# Patient Record
Sex: Female | Born: 2000 | Race: Black or African American | Hispanic: No | Marital: Single | Smoking: Never smoker
Health system: Southern US, Community
[De-identification: ages and names within clinical notes are randomized; demographics above are authoritative.]

## PROBLEM LIST (undated history)

## (undated) DIAGNOSIS — E669 Obesity, unspecified: Secondary | ICD-10-CM

## (undated) DIAGNOSIS — J329 Chronic sinusitis, unspecified: Secondary | ICD-10-CM

## (undated) DIAGNOSIS — T7840XA Allergy, unspecified, initial encounter: Secondary | ICD-10-CM

## (undated) HISTORY — DX: Allergy, unspecified, initial encounter: T78.40XA

## (undated) HISTORY — DX: Chronic sinusitis, unspecified: J32.9

## (undated) HISTORY — PX: NO PAST SURGERIES: SHX2092

## (undated) HISTORY — DX: Obesity, unspecified: E66.9

---
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
## 2022-05-29 ENCOUNTER — Encounter: Payer: Self-pay | Admitting: Student

## 2022-07-08 DIAGNOSIS — G44209 Tension-type headache, unspecified, not intractable: Secondary | ICD-10-CM | POA: Diagnosis not present

## 2023-02-20 NOTE — L&D Delivery Note (Signed)
 [facility] Delivery Note  Robin Shepherd is a 23 y.o. G1P0 s/p SVD at [redacted]w[redacted]d. She was admitted for pre-term labor.   ROM: 6h 28m with clear fluid GBS Status: negative Maximum Maternal Temperature: 99.34F  Labor Progress: Initial SVE 5/80/-1, progressed with AROM, and pitocin for contractions, delivered soon after  Delivery Date/Time: 01/18/2024 1948 Delivery: Called to room and patient was complete and pushing. Head delivered ROA. No nuchal cord present. Shoulder and body delivered in usual fashion. Infant with spontaneous cry, placed on mother's abdomen, dried and stimulated. Cord clamped x 2 after cord stopped pulsing, and cut by father of baby. Cord blood drawn. Placenta delivered spontaneously, intact, with 3-vessel cord. Fundus firm with massage and Pitocin. Labia, perineum, vagina, and cervix inspected, 2nd degree perineal laceration found and repaired with 3-0 Vicryl.   Placenta: Intact, delivered spontaneously Complications: None Lacerations: 2nd degree perineal, repaired EBL: pending Analgesia: Epidural   Infant: Viable female  APGARs 9, 9  Weight pending  Charlie DELENA Courts, MD 01/18/2024, 8:36 PM

## 2023-07-27 ENCOUNTER — Telehealth: Admitting: Physician Assistant

## 2023-07-27 DIAGNOSIS — G43009 Migraine without aura, not intractable, without status migrainosus: Secondary | ICD-10-CM | POA: Diagnosis not present

## 2023-07-27 MED ORDER — SUMATRIPTAN SUCCINATE 25 MG PO TABS: 25.0000 mg | ORAL_TABLET | Freq: Once | ORAL | 0 refills | Status: DC

## 2023-07-27 NOTE — Patient Instructions (Signed)
  Cabrini G Okey, thank you for joining Hyla Maillard, PA-C for today's virtual visit.  While this provider is not your primary care provider (PCP), if your PCP is located in our provider database this encounter information will be shared with them immediately following your visit.   A [facility] MyChart account gives you access to today's visit and all your visits, tests, and labs performed at [facility] " click here if you don't have a [facility] MyChart account or go to [email or web address]  Consent: (Patient) Cassaundra Cleverly provided verbal consent for this virtual visit at the beginning of the encounter.  Current Medications:  Current Outpatient Medications:    aspirin-acetaminophen-caffeine (EXCEDRIN MIGRAINE) 250-250-65 MG tablet, Take by mouth every 6 (six) hours as needed for headache., Disp: , Rfl:    SUMAtriptan (IMITREX) 25 MG tablet, Take 1 tablet (25 mg total) by mouth once for 1 dose. May repeat in 2 hours if headache persists or recurs., Disp: 10 tablet, Rfl: 0   Medications ordered in this encounter:  Meds ordered this encounter  Medications   SUMAtriptan (IMITREX) 25 MG tablet    Sig: Take 1 tablet (25 mg total) by mouth once for 1 dose. May repeat in 2 hours if headache persists or recurs.    Dispense:  10 tablet    Refill:  0    Supervising Provider:   Corine Dice [[id number]]     *If you need refills on other medications prior to your next appointment, please contact your pharmacy*  Follow-Up: Call back or seek an in-person evaluation if the symptoms worsen or if the condition fails to improve as anticipated.  [facility] [phone]  Other Instructions Please take the Sumatriptan as directed for acute migraine.  Increase fluids.  Please contact OBGYN first thing this coming week for follow-up and ongoing management during pregnancy.  ER for any acutely worsening symptoms despite treatment.   If you have been  instructed to have an in-person evaluation today at a local Urgent Care facility, please use the link below. It will take you to a list of all of our available [facility], including address, phone number and hours of operation. Please do not delay care.  [facility]  If you or a family member do not have a primary care provider, use the link below to schedule a visit and establish care. When you choose a [facility] primary care physician or advanced practice provider, you gain a long-term partner in health. Find a Primary Care Provider  Learn more about [facility] in-office and virtual care options:  - Get Care Now

## 2023-07-27 NOTE — Progress Notes (Signed)
 Virtual Visit Consent   Robin Shepherd, you are scheduled for a virtual visit with a  provider today. Just as with appointments in the office, your consent must be obtained to participate. Your consent will be active for this visit and any virtual visit you may have with one of our providers in the next 365 days. If you have a MyChart account, a copy of this consent can be sent to you electronically.  As this is a virtual visit, video technology does not allow for your provider to perform a traditional examination. This may limit your provider's ability to fully assess your condition. If your provider identifies any concerns that need to be evaluated in person or the need to arrange testing (such as labs, EKG, etc.), we will make arrangements to do so. Although advances in technology are sophisticated, we cannot ensure that it will always work on either your end or our end. If the connection with a video visit is poor, the visit may have to be switched to a telephone visit. With either a video or telephone visit, we are not always able to ensure that we have a secure connection.  By engaging in this virtual visit, you consent to the provision of healthcare and authorize for your insurance to be billed (if applicable) for the services provided during this visit. Depending on your insurance coverage, you may receive a charge related to this service.  I need to obtain your verbal consent now. Are you willing to proceed with your visit today? Robin Shepherd has provided verbal consent on 07/27/2023 for a virtual visit (video or telephone). Hyla Maillard, [place]  Date: 07/27/2023 6:56 PM   Virtual Visit via Video Note   I, Hyla Maillard, connected with  Robin Shepherd  ([id number], June 07, 2000) on 07/27/23 at  7:00 PM EDT by a video-enabled telemedicine application and verified that I am speaking with the correct person using two identifiers.  Location: Patient: Virtual Visit  Location Patient: Home Provider: Virtual Visit Location Provider: Home Office   I discussed the limitations of evaluation and management by telemedicine and the availability of in person appointments. The patient expressed understanding and agreed to proceed.    History of Present Illness: Robin Shepherd is a 23 y.o. who identifies as a female who was assigned female at birth, and is being seen today for periodic migraine headache, more consistent over the past few weeks. Notes headache is frontal and sometimes unilateral, or other times bilateral. Is associated with nausea. Denies aura. Notes that symptoms seem worse when its hotter outside. Notes she is staying well-hydrated. Notes well-balanced diet. Is currently pregnant, followed by OBGYN, having initial appt already. Noted normal BP at that time. Has been taking OTC tylenol and Tylenol-Aspirine-Caffeine in combination without relief of headaches. Notes pain is 6/10. Notes getting a migraine about once every few weeks.   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 May repeat in 2 hours if headache persists or recurs., Disp: 10 tablet, Rfl: 0  Observations/Objective: Patient is well-developed, well-nourished in no acute distress.  Resting comfortably at home.  Head is normocephalic, atraumatic.  No labored breathing. Speech is clear and coherent with logical content.  Patient is alert and oriented at baseline.   Assessment and Plan: 1. Migraine without aura and without status  migrainosus, not intractable (Primary) - SUMAtriptan (IMITREX) 25 MG tablet; Take 1 tablet (25 mg total) by mouth once for 1 dose. May repeat in 2 hours if headache persists or recurs.  Dispense: 10 tablet; Refill: 0  No alarm signs or symptoms. Patient is > [redacted] weeks pregnant without history of cardiovascular disease. Poor response to first line treatment (Acetaminophen) and second line treatments (Acetaminophen-ASA-caffeine). As such, will treat with low dose Sumatriptan for abortive therapy per UTD guidelines. Increase fluids. Keep symptoms journal. She is to contact OBGYN first thing this coming week for follow-up and ongoing management during pregnancy. ER precautions reviewed.  Follow Up Instructions: I discussed the assessment and treatment plan with the patient. The patient was provided an opportunity to ask questions and all were answered. The patient agreed with the plan and demonstrated an understanding of the instructions.  A copy of instructions were sent to the patient via MyChart unless otherwise noted below.   The patient was advised to call back or seek an in-person evaluation if the symptoms worsen or if the condition fails to improve as anticipated.    Hyla Maillard, PA-C

## 2023-07-30 ENCOUNTER — Encounter: Payer: Self-pay | Admitting: *Deleted

## 2023-12-12 ENCOUNTER — Other Ambulatory Visit: Payer: Self-pay | Admitting: Nurse Practitioner

## 2023-12-12 DIAGNOSIS — Z36 Encounter for antenatal screening for chromosomal anomalies: Secondary | ICD-10-CM

## 2023-12-12 DIAGNOSIS — Z113 Encounter for screening for infections with a predominantly sexual mode of transmission: Secondary | ICD-10-CM | POA: Diagnosis not present

## 2023-12-12 DIAGNOSIS — Z3182 Encounter for Rh incompatibility status: Secondary | ICD-10-CM | POA: Diagnosis not present

## 2023-12-12 DIAGNOSIS — Z31430 Encounter of female for testing for genetic disease carrier status for procreative management: Secondary | ICD-10-CM | POA: Diagnosis not present

## 2023-12-12 DIAGNOSIS — Z3401 Encounter for supervision of normal first pregnancy, first trimester: Secondary | ICD-10-CM | POA: Diagnosis not present

## 2023-12-12 DIAGNOSIS — Z23 Encounter for immunization: Secondary | ICD-10-CM | POA: Diagnosis not present

## 2023-12-12 DIAGNOSIS — Z3403 Encounter for supervision of normal first pregnancy, third trimester: Secondary | ICD-10-CM | POA: Diagnosis not present

## 2023-12-12 LAB — OB RESULTS CONSOLE HEPATITIS B SURFACE ANTIGEN: Hepatitis B Surface Ag: NEGATIVE

## 2023-12-12 LAB — OB RESULTS CONSOLE GC/CHLAMYDIA
Chlamydia: NEGATIVE
Neisseria Gonorrhea: NEGATIVE

## 2023-12-12 LAB — HEPATITIS C ANTIBODY: HCV Ab: NEGATIVE

## 2023-12-12 LAB — OB RESULTS CONSOLE VARICELLA ZOSTER ANTIBODY, IGG: Varicella: IMMUNE

## 2023-12-12 LAB — OB RESULTS CONSOLE RUBELLA ANTIBODY, IGM: Rubella: IMMUNE

## 2023-12-12 LAB — OB RESULTS CONSOLE HIV ANTIBODY (ROUTINE TESTING): HIV: NONREACTIVE

## 2024-01-02 DIAGNOSIS — O0930 Supervision of pregnancy with insufficient antenatal care, unspecified trimester: Secondary | ICD-10-CM | POA: Diagnosis not present

## 2024-01-02 DIAGNOSIS — O2603 Excessive weight gain in pregnancy, third trimester: Secondary | ICD-10-CM | POA: Diagnosis not present

## 2024-01-02 DIAGNOSIS — Z3403 Encounter for supervision of normal first pregnancy, third trimester: Secondary | ICD-10-CM | POA: Diagnosis not present

## 2024-01-02 DIAGNOSIS — O99013 Anemia complicating pregnancy, third trimester: Secondary | ICD-10-CM | POA: Diagnosis not present

## 2024-01-15 DIAGNOSIS — O99013 Anemia complicating pregnancy, third trimester: Secondary | ICD-10-CM | POA: Diagnosis not present

## 2024-01-15 DIAGNOSIS — O2603 Excessive weight gain in pregnancy, third trimester: Secondary | ICD-10-CM | POA: Diagnosis not present

## 2024-01-15 DIAGNOSIS — Z3403 Encounter for supervision of normal first pregnancy, third trimester: Secondary | ICD-10-CM | POA: Diagnosis not present

## 2024-01-16 ENCOUNTER — Encounter

## 2024-01-17 ENCOUNTER — Inpatient Hospital Stay (HOSPITAL_COMMUNITY)
Admission: AD | Admit: 2024-01-17 | Discharge: 2024-01-17 | Disposition: A | Discharge status: Home / Self Care | Attending: Emergency Medicine | Admitting: Emergency Medicine

## 2024-01-17 ENCOUNTER — Encounter (HOSPITAL_BASED_OUTPATIENT_CLINIC_OR_DEPARTMENT_OTHER): Payer: Self-pay | Admitting: Emergency Medicine

## 2024-01-17 ENCOUNTER — Other Ambulatory Visit: Payer: Self-pay

## 2024-01-17 DIAGNOSIS — Z3A36 36 weeks gestation of pregnancy: Secondary | ICD-10-CM | POA: Insufficient documentation

## 2024-01-17 DIAGNOSIS — I959 Hypotension, unspecified: Secondary | ICD-10-CM | POA: Diagnosis not present

## 2024-01-17 DIAGNOSIS — O209 Hemorrhage in early pregnancy, unspecified: Secondary | ICD-10-CM | POA: Insufficient documentation

## 2024-01-17 DIAGNOSIS — O200 Threatened abortion: Secondary | ICD-10-CM | POA: Diagnosis not present

## 2024-01-17 DIAGNOSIS — Z0371 Encounter for suspected problem with amniotic cavity and membrane ruled out: Secondary | ICD-10-CM | POA: Diagnosis not present

## 2024-01-17 DIAGNOSIS — R58 Hemorrhage, not elsewhere classified: Secondary | ICD-10-CM | POA: Diagnosis not present

## 2024-01-17 DIAGNOSIS — Z3689 Encounter for other specified antenatal screening: Secondary | ICD-10-CM

## 2024-01-17 LAB — URINALYSIS, ROUTINE W REFLEX MICROSCOPIC
Bilirubin Urine: NEGATIVE
Glucose, UA: NEGATIVE mg/dL
Ketones, ur: NEGATIVE mg/dL
Nitrite: NEGATIVE
Protein, ur: NEGATIVE mg/dL
Specific Gravity, Urine: <=1.005 (ref 1.005–1.030)
pH: 6 (ref 5.0–8.0)

## 2024-01-17 LAB — BASIC METABOLIC PANEL WITH GFR
Anion gap: 11 (ref 5–15)
BUN: 8 mg/dL (ref 6–20)
CO2: 21 mmol/L — ABNORMAL LOW (ref 22–32)
Calcium: 9.3 mg/dL (ref 8.9–10.3)
Chloride: 104 mmol/L (ref 98–111)
Creatinine, Ser: 0.69 mg/dL (ref 0.44–1.00)
GFR, Estimated: >60 mL/min (ref >60)
Glucose, Bld: 96 mg/dL (ref 70–99)
Potassium: 4.2 mmol/L (ref 3.5–5.1)
Sodium: 136 mmol/L (ref 135–145)

## 2024-01-17 LAB — CBC WITH DIFFERENTIAL/PLATELET
Abs Immature Granulocytes: 0.04 K/uL (ref 0.00–0.07)
Basophils Absolute: 0 K/uL (ref 0.0–0.1)
Basophils Relative: 0 %
Eosinophils Absolute: 0.1 K/uL (ref 0.0–0.5)
Eosinophils Relative: 1 %
HCT: 31.9 % — ABNORMAL LOW (ref 36.0–46.0)
Hemoglobin: 10.8 g/dL — ABNORMAL LOW (ref 12.0–15.0)
Immature Granulocytes: 0 %
Lymphocytes Relative: 18 %
Lymphs Abs: 1.8 K/uL (ref 0.7–4.0)
MCH: 25.3 pg — ABNORMAL LOW (ref 26.0–34.0)
MCHC: 33.9 g/dL (ref 30.0–36.0)
MCV: 74.7 fL — ABNORMAL LOW (ref 80.0–100.0)
Monocytes Absolute: 1.1 K/uL — ABNORMAL HIGH (ref 0.1–1.0)
Monocytes Relative: 11 %
Neutro Abs: 6.9 K/uL (ref 1.7–7.7)
Neutrophils Relative %: 70 %
Platelets: 235 K/uL (ref 150–400)
RBC: 4.27 MIL/uL (ref 3.87–5.11)
RDW: 14.5 % (ref 11.5–15.5)
WBC: 9.8 K/uL (ref 4.0–10.5)
nRBC: 0 % (ref 0.0–0.2)

## 2024-01-17 LAB — URINALYSIS, MICROSCOPIC (REFLEX)

## 2024-01-17 LAB — POCT FERN TEST: POCT Fern Test: NEGATIVE

## 2024-01-17 MED ORDER — SODIUM CHLORIDE 0.9 % IV BOLUS
500.0000 mL | Freq: Once | INTRAVENOUS | Status: AC
  Administered 2024-01-17: 500 mL via INTRAVENOUS

## 2024-01-17 NOTE — ED Triage Notes (Signed)
 Pt 8 almost 9 mos pregnant. Started having some blood when wiping.

## 2024-01-17 NOTE — ED Notes (Signed)
 During this pts assessment, it was determined by the edp that her transfer to a higher level of care was urgent due to her OB status, a current [facility] unit on scene noted that the other unit was located in [place] and they are only isolett capable.  [facility] was called to expedite this pts care

## 2024-01-17 NOTE — ED Provider Notes (Signed)
 [facility] is out of network at [facility] so EMS had to be contacted due to times.     Myia Bergh, MD 01/17/24 0430

## 2024-01-17 NOTE — Progress Notes (Signed)
 9582BETHA BOYDEN called for pt at [facility] ED. Came in with c/o vaginal bleeding when wiping and abdominal pain rated 4/10. PNC at [facility] [place] per pt. Endorses fetal movement. Unsure of membrane status.  9580: Pt placed on monitor.  0430BETHA Florence Sayre, [facility] ED RN about remote monitoring going down. Will troubleshoot.   9566: Dr. Abigail called about pt, ED MD already called, will transfer to MAU.  0435: Lum, MAU RN called for report.  0435BETHA Sayre, [facility] ED RN called, unable to re-establish remote monitoring. Transport at bedside for transfer to MAU.

## 2024-01-17 NOTE — MAU Note (Signed)
 Robin Shepherd is a 23 y.o. at [redacted]w[redacted]d here in MAU reporting: Gush of fluid around 0300, not feeling any ctx or pain. Some bloody show, reports +FM  LMP:  Onset of complaint: 0300 Pain score: 0/10 Vitals:   01/17/24 0417 01/17/24 0527  BP: (!) 98/54 (!) 133/96  Pulse: 91 94  Resp: 20 18  Temp: 98.4 F (36.9 C) 98.5 F (36.9 C)  SpO2: 100% 100%     FHT: pt placed on EFM  Lab orders placed from triage: [place]

## 2024-01-17 NOTE — MAU Provider Note (Signed)
 Event Date/Time   First Provider Initiated Contact with Patient 01/17/24 0540      S: [name] is a 23 y.o. G1P0 at [redacted]w[redacted]d  who presents to MAU today complaining of gush of fluid at 0300. She endorses vaginal bleeding that she describes as mucous consistency.  She denies contractions. She reports normal fetal movement.    O: BP (!) 133/96 (BP Location: Left Arm) Comment: pt moving  Pulse 94   Temp 98.5 F (36.9 C) (Oral)   Resp 18   Ht 5' 5 (1.651 m)   Wt 84.4 kg   SpO2 100%   BMI 30.95 kg/m  GENERAL: Well-developed, well-nourished female in no acute distress.  HEAD: Normocephalic, atraumatic.  CHEST: Normal effort of breathing, regular heart rate ABDOMEN: Soft, nontender, gravid PELVIC: Normal external female genitalia. Vagina is pink and rugated. Cervix with normal contour, no lesions. Normal discharge.  Negative pooling. No leaking noted with valsalva maneuver. Fern Collected  Cervical exam:  Dilation: 3 Effacement (%): 40 Station: -2 Presentation: Vertex Exam by:: Amy S., RN   Fetal Monitoring: FHT: 130 bpm, Mod Var, -Decels, +Accels Toco: Irregular  Results for orders placed or performed during the hospital encounter of 01/17/24 (from the past 24 hours)  CBC with Differential     Status: Abnormal   Collection Time: 01/17/24  4:33 AM  Result Value Ref Range   WBC 9.8 4.0 - 10.5 K/uL   RBC 4.27 3.87 - 5.11 MIL/uL   Hemoglobin 10.8 (L) 12.0 - 15.0 g/dL   HCT 68.0 (L) 63.9 - 53.9 %   MCV 74.7 (L) 80.0 - 100.0 fL   MCH 25.3 (L) 26.0 - 34.0 pg   MCHC 33.9 30.0 - 36.0 g/dL   RDW 85.4 88.4 - 84.4 %   Platelets 235 150 - 400 K/uL   nRBC 0.0 0.0 - 0.2 %   Neutrophils Relative % 70 %   Neutro Abs 6.9 1.7 - 7.7 K/uL   Lymphocytes Relative 18 %   Lymphs Abs 1.8 0.7 - 4.0 K/uL   Monocytes Relative 11 %   Monocytes Absolute 1.1 (H) 0.1 - 1.0 K/uL   Eosinophils Relative 1 %   Eosinophils Absolute 0.1 0.0 - 0.5 K/uL   Basophils Relative 0 %   Basophils  Absolute 0.0 0.0 - 0.1 K/uL   Immature Granulocytes 0 %   Abs Immature Granulocytes 0.04 0.00 - 0.07 K/uL  Basic metabolic panel     Status: Abnormal   Collection Time: 01/17/24  4:33 AM  Result Value Ref Range   Sodium 136 135 - 145 mmol/L   Potassium 4.2 3.5 - 5.1 mmol/L   Chloride 104 98 - 111 mmol/L   CO2 21 (L) 22 - 32 mmol/L   Glucose, Bld 96 70 - 99 mg/dL   BUN 8 6 - 20 mg/dL   Creatinine, Ser 9.30 0.44 - 1.00 mg/dL   Calcium 9.3 8.9 - 89.6 mg/dL   GFR, Estimated >39 >39 mL/min   Anion gap 11 5 - 15  Urinalysis, Routine w reflex microscopic -Urine, Clean Catch     Status: Abnormal   Collection Time: 01/17/24  4:45 AM  Result Value Ref Range   Color, Urine STRAW (A) YELLOW   APPearance HAZY (A) CLEAR   Specific Gravity, Urine <=1.005 1.005 - 1.030   pH 6.0 5.0 - 8.0   Glucose, UA NEGATIVE NEGATIVE mg/dL   Hgb urine dipstick MODERATE (A) NEGATIVE   Bilirubin Urine NEGATIVE NEGATIVE  Ketones, ur NEGATIVE NEGATIVE mg/dL   Protein, ur NEGATIVE NEGATIVE mg/dL   Nitrite NEGATIVE NEGATIVE   Leukocytes,Ua TRACE (A) NEGATIVE  Urinalysis, Microscopic (reflex)     Status: Abnormal   Collection Time: 01/17/24  4:45 AM  Result Value Ref Range   RBC / HPF 0-5 0 - 5 RBC/hpf   WBC, UA 11-20 0 - 5 WBC/hpf   Bacteria, UA FEW (A) NONE SEEN   Squamous Epithelial / HPF 0-5 0 - 5 /HPF   Sperm, UA PRESENT   POCT fern test     Status: None   Collection Time: 01/17/24  5:33 AM  Result Value Ref Range   POCT Fern Test Negative = intact amniotic membranes     A: SIUP at [redacted]w[redacted]d  Cat I FT Membranes intact  P: Fern negative x 2. NST reactive Will monitor and have nurse recheck for cervical change.   Synthia Raisin, CNM 01/17/2024 5:40 AM   Reassessment (6:36 AM) -Nurse reports no cervical change. -Precautions provided in AVS.  -Discharged to home in stable condition.  Raisin LITTIE Synthia MSN, CNM Advanced Practice Provider, [facility] for [employer]

## 2024-01-17 NOTE — ED Provider Notes (Addendum)
 [facility] Provider Note   CSN: [id number] Arrival date & time: 01/17/24  0400     Patient presents with: Vaginal Bleeding   Robin Shepherd is a 23 y.o. female.   The history is provided by the patient.  Vaginal Bleeding Quality:  Dark red Severity:  Mild Onset quality:  Sudden Timing:  Constant Progression:  Unchanged Chronicity:  New Possible pregnancy: yes   Context: spontaneously   Relieved by:  Nothing Worsened by:  Nothing Ineffective treatments:  None tried Risk factors: no bleeding disorder   Patient is G2P0, at 80 and 1 presents with leakage of blood and fluid per vagina.       Prior to Admission medications   Medication Sig Start Date End Date Taking? Authorizing Provider  ferrous sulfate 324 MG TBEC Take 324 mg by mouth.   Yes [provider]  folic acid (FOLVITE) 400 MCG tablet Take 400 mcg by mouth daily.   Yes [provider]  Prenatal Vit-Fe Fumarate-FA (PRENATAL MULTIVITAMIN) TABS tablet Take 1 tablet by mouth daily at 12 noon.   Yes [provider]  aspirin-acetaminophen-caffeine (EXCEDRIN MIGRAINE) 250-250-65 MG tablet Take by mouth every 6 (six) hours as needed for headache.    [provider]  SUMAtriptan  (IMITREX ) 25 MG tablet Take 1 tablet (25 mg total) by mouth once for 1 dose. May repeat in 2 hours if headache persists or recurs. 07/27/23 07/27/23  Gladis Elsie BROCKS, PA-C    Allergies: Patient has no known allergies.    Review of Systems  Genitourinary:  Positive for vaginal bleeding.  All other systems reviewed and are negative.   Updated Vital Signs BP (!) 98/54 (BP Location: Right Arm)   Pulse 91   Temp 98.4 F (36.9 C) (Oral)   Resp 20   Ht 5' 5 (1.651 m)   Wt 84.4 kg   SpO2 100%   BMI 30.95 kg/m   Physical Exam Vitals and nursing note reviewed.  Constitutional:      General: She is not in acute distress.    Appearance: She is well-developed.   HENT:     Head: Normocephalic and atraumatic.     Nose: Nose normal.  Eyes:     Pupils: Pupils are equal, round, and reactive to light.  Cardiovascular:     Rate and Rhythm: Normal rate and regular rhythm.     Pulses: Normal pulses.     Heart sounds: Normal heart sounds.  Pulmonary:     Effort: Pulmonary effort is normal. No respiratory distress.     Breath sounds: Normal breath sounds.  Abdominal:     General: Bowel sounds are normal.     Palpations: Abdomen is soft.     Tenderness: There is no abdominal tenderness. There is no guarding or rebound.     Comments: Gravid, fetal movement palpable   Genitourinary:    Comments: Sterile glove exam amniotic fluid per vagina.  No fetal parts  Musculoskeletal:        General: Normal range of motion.     Cervical back: Neck supple.  Skin:    General: Skin is warm and dry.     Capillary Refill: Capillary refill takes less than 2 seconds.     Findings: No erythema or rash.  Neurological:     General: No focal deficit present.     Deep Tendon Reflexes: Reflexes normal.  Psychiatric:        Mood and Affect:  Mood normal.     (all labs ordered are listed, but only abnormal results are displayed) Labs Reviewed  WET PREP, GENITAL  CBC WITH DIFFERENTIAL/PLATELET  BASIC METABOLIC PANEL WITH GFR  GC/CHLAMYDIA PROBE AMP ([place]) NOT AT [facility]    EKG: None  Radiology: No results found.   Procedures   Medications Ordered in the ED  sodium chloride 0.9 % bolus 500 mL (has no administration in time range)                                    Medical Decision Making Patient with bleeding and is 36/1   Amount and/or Complexity of Data Reviewed External Data Reviewed: notes.    Details: Previous notes reviewed  Labs: ordered. Discussion of management or test interpretation with external provider(s): 4:15 AM case d/w Dr. Abigail of OB who accepts to the MAU  Risk Risk Details: Patient placed on toco and transmitted to [facility]  hospital immediately.  Sterile glove exam performed with chaperones present.  No signs of imminent delivery, no presenting parts.  Discussed with Dr. Abigail who accepts to the MAU. Labs sent. Good variability on monitor.  Will need to be seen by OB, will transfer to MAU for definitive care by OB.       Final diagnoses:  Vaginal bleeding in pregnancy   The patient appears reasonably stabilized for admission considering the current resources, flow, and capabilities available in the ED at this time, and I doubt any other [facility] requiring further screening and/or treatment in the ED prior to admission.     Elvie Palomo, MD 01/17/24 9551

## 2024-01-18 ENCOUNTER — Inpatient Hospital Stay (HOSPITAL_COMMUNITY): Admitting: Anesthesiology

## 2024-01-18 ENCOUNTER — Encounter (HOSPITAL_COMMUNITY): Payer: Self-pay | Admitting: Obstetrics and Gynecology

## 2024-01-18 ENCOUNTER — Inpatient Hospital Stay (HOSPITAL_COMMUNITY)
Admission: AD | Admit: 2024-01-18 | Discharge: 2024-01-20 | DRG: 807 | Disposition: A | Discharge status: Home / Self Care | Payer: Self-pay | Attending: Obstetrics and Gynecology | Admitting: Obstetrics and Gynecology

## 2024-01-18 DIAGNOSIS — Z3A36 36 weeks gestation of pregnancy: Secondary | ICD-10-CM | POA: Diagnosis not present

## 2024-01-18 DIAGNOSIS — O99214 Obesity complicating childbirth: Secondary | ICD-10-CM | POA: Diagnosis not present

## 2024-01-18 DIAGNOSIS — O209 Hemorrhage in early pregnancy, unspecified: Secondary | ICD-10-CM | POA: Diagnosis not present

## 2024-01-18 DIAGNOSIS — O9902 Anemia complicating childbirth: Secondary | ICD-10-CM | POA: Diagnosis present

## 2024-01-18 DIAGNOSIS — O0933 Supervision of pregnancy with insufficient antenatal care, third trimester: Secondary | ICD-10-CM | POA: Diagnosis not present

## 2024-01-18 DIAGNOSIS — Z0371 Encounter for suspected problem with amniotic cavity and membrane ruled out: Secondary | ICD-10-CM | POA: Diagnosis not present

## 2024-01-18 LAB — COMPREHENSIVE METABOLIC PANEL WITH GFR
ALT: 10 U/L (ref 0–44)
AST: 41 U/L (ref 15–41)
Albumin: 3.3 g/dL — ABNORMAL LOW (ref 3.5–5.0)
Alkaline Phosphatase: 351 U/L — ABNORMAL HIGH (ref 38–126)
Anion gap: 15 (ref 5–15)
BUN: 5 mg/dL — ABNORMAL LOW (ref 6–20)
CO2: 17 mmol/L — ABNORMAL LOW (ref 22–32)
Calcium: 9.1 mg/dL (ref 8.9–10.3)
Chloride: 103 mmol/L (ref 98–111)
Creatinine, Ser: 0.92 mg/dL (ref 0.44–1.00)
GFR, Estimated: >60 mL/min (ref >60)
Glucose, Bld: 95 mg/dL (ref 70–99)
Potassium: 3.5 mmol/L (ref 3.5–5.1)
Sodium: 135 mmol/L (ref 135–145)
Total Bilirubin: 0.9 mg/dL (ref 0.0–1.2)
Total Protein: 7.1 g/dL (ref 6.5–8.1)

## 2024-01-18 LAB — CBC
HCT: 33.5 % — ABNORMAL LOW (ref 36.0–46.0)
Hemoglobin: 11.3 g/dL — ABNORMAL LOW (ref 12.0–15.0)
MCH: 25.2 pg — ABNORMAL LOW (ref 26.0–34.0)
MCHC: 33.7 g/dL (ref 30.0–36.0)
MCV: 74.8 fL — ABNORMAL LOW (ref 80.0–100.0)
Platelets: 264 K/uL (ref 150–400)
RBC: 4.48 MIL/uL (ref 3.87–5.11)
RDW: 14.6 % (ref 11.5–15.5)
WBC: 17.7 K/uL — ABNORMAL HIGH (ref 4.0–10.5)
nRBC: 0 % (ref 0.0–0.2)

## 2024-01-18 LAB — TYPE AND SCREEN
ABO/RH(D): O POS
Antibody Screen: NEGATIVE

## 2024-01-18 LAB — GROUP B STREP BY PCR: Group B strep by PCR: NEGATIVE

## 2024-01-18 LAB — SYPHILIS: RPR W/REFLEX TO RPR TITER AND TREPONEMAL ANTIBODIES, TRADITIONAL SCREENING AND DIAGNOSIS ALGORITHM: RPR Ser Ql: NONREACTIVE

## 2024-01-18 MED ORDER — LIDOCAINE-EPINEPHRINE (PF) 2 %-1:200000 IJ SOLN
INTRAMUSCULAR | Status: DC | PRN
  Administered 2024-01-18: 3 mL via EPIDURAL

## 2024-01-18 MED ORDER — PRENATAL MULTIVITAMIN CH
1.0000 | ORAL_TABLET | Freq: Every day | ORAL | Status: DC
  Administered 2024-01-19 – 2024-01-20 (×2): 1 via ORAL
  Filled 2024-01-18 (×2): qty 1

## 2024-01-18 MED ORDER — OXYTOCIN BOLUS FROM INFUSION
333.0000 mL | Freq: Once | INTRAVENOUS | Status: AC
  Administered 2024-01-18: 333 mL via INTRAVENOUS

## 2024-01-18 MED ORDER — OXYCODONE-ACETAMINOPHEN 5-325 MG PO TABS: 1.0000 | ORAL_TABLET | ORAL | Status: DC | PRN

## 2024-01-18 MED ORDER — DIPHENHYDRAMINE HCL 25 MG PO CAPS: 25.0000 mg | ORAL_CAPSULE | Freq: Four times a day (QID) | ORAL | Status: DC | PRN

## 2024-01-18 MED ORDER — SENNOSIDES-DOCUSATE SODIUM 8.6-50 MG PO TABS
2.0000 | ORAL_TABLET | ORAL | Status: DC
  Administered 2024-01-18 – 2024-01-20 (×2): 2 via ORAL
  Filled 2024-01-18 (×2): qty 2

## 2024-01-18 MED ORDER — LACTATED RINGERS IV SOLN: 500.0000 mL | INTRAVENOUS | Status: DC | PRN

## 2024-01-18 MED ORDER — PHENYLEPHRINE 80 MCG/ML (10ML) SYRINGE FOR IV PUSH (FOR BLOOD PRESSURE SUPPORT)
80.0000 ug | PREFILLED_SYRINGE | INTRAVENOUS | Status: DC | PRN
Start: 2024-01-18 — End: 2024-01-18

## 2024-01-18 MED ORDER — SODIUM CHLORIDE 0.9% FLUSH: 3.0000 mL | INTRAVENOUS | Status: DC | PRN

## 2024-01-18 MED ORDER — EPHEDRINE 5 MG/ML INJ: 10.0000 mg | INTRAVENOUS | Status: DC | PRN

## 2024-01-18 MED ORDER — OXYTOCIN-SODIUM CHLORIDE 30-0.9 UT/500ML-% IV SOLN
2.5000 [IU]/h | INTRAVENOUS | Status: DC
  Filled 2024-01-18: qty 500

## 2024-01-18 MED ORDER — FENTANYL-BUPIVACAINE-NACL 0.5-0.125-0.9 MG/250ML-% EP SOLN
12.0000 mL/h | EPIDURAL | Status: DC | PRN
  Administered 2024-01-18: 12 mL/h via EPIDURAL
  Filled 2024-01-18: qty 250

## 2024-01-18 MED ORDER — PENICILLIN G POT IN DEXTROSE 60000 UNIT/ML IV SOLN
3.0000 10*6.[IU] | INTRAVENOUS | Status: DC
  Administered 2024-01-18 (×2): 3 10*6.[IU] via INTRAVENOUS
  Filled 2024-01-18 (×3): qty 50

## 2024-01-18 MED ORDER — SOD CITRATE-CITRIC ACID 500-334 MG/5ML PO SOLN: 30.0000 mL | ORAL | Status: DC | PRN

## 2024-01-18 MED ORDER — LACTATED RINGERS IV SOLN
500.0000 mL | Freq: Once | INTRAVENOUS | Status: AC
  Administered 2024-01-18: 500 mL via INTRAVENOUS

## 2024-01-18 MED ORDER — MEASLES, MUMPS & RUBELLA VAC ~~LOC~~ SUSR: 0.5000 mL | Freq: Once | SUBCUTANEOUS | Status: DC

## 2024-01-18 MED ORDER — FENTANYL CITRATE (PF) 100 MCG/2ML IJ SOLN: 50.0000 ug | INTRAMUSCULAR | Status: DC | PRN

## 2024-01-18 MED ORDER — PHENYLEPHRINE 80 MCG/ML (10ML) SYRINGE FOR IV PUSH (FOR BLOOD PRESSURE SUPPORT): 80.0000 ug | PREFILLED_SYRINGE | INTRAVENOUS | Status: DC | PRN

## 2024-01-18 MED ORDER — BENZOCAINE-MENTHOL 20-0.5 % EX AERO
1.0000 | INHALATION_SPRAY | CUTANEOUS | Status: DC | PRN
  Administered 2024-01-19: 1 via TOPICAL
  Filled 2024-01-18: qty 56

## 2024-01-18 MED ORDER — OXYTOCIN-SODIUM CHLORIDE 30-0.9 UT/500ML-% IV SOLN
1.0000 m[IU]/min | INTRAVENOUS | Status: DC
  Administered 2024-01-18: 2 m[IU]/min via INTRAVENOUS

## 2024-01-18 MED ORDER — DIPHENHYDRAMINE HCL 50 MG/ML IJ SOLN: 12.5000 mg | INTRAMUSCULAR | Status: DC | PRN

## 2024-01-18 MED ORDER — LACTATED RINGERS IV SOLN: 500.0000 mL | Freq: Once | INTRAVENOUS | Status: DC

## 2024-01-18 MED ORDER — ONDANSETRON HCL 4 MG/2ML IJ SOLN: 4.0000 mg | INTRAMUSCULAR | Status: DC | PRN

## 2024-01-18 MED ORDER — LACTATED RINGERS IV SOLN: INTRAVENOUS | Status: DC

## 2024-01-18 MED ORDER — ONDANSETRON HCL 4 MG/2ML IJ SOLN
INTRAMUSCULAR | Status: AC
  Administered 2024-01-18: 4 mg via INTRAVENOUS
  Filled 2024-01-18: qty 2

## 2024-01-18 MED ORDER — ACETAMINOPHEN 325 MG PO TABS: 650.0000 mg | ORAL_TABLET | ORAL | Status: DC | PRN

## 2024-01-18 MED ORDER — TERBUTALINE SULFATE 1 MG/ML IJ SOLN: 0.2500 mg | Freq: Once | INTRAMUSCULAR | Status: DC | PRN

## 2024-01-18 MED ORDER — TETANUS-DIPHTH-ACELL PERTUSSIS 5-2-15.5 LF-MCG/0.5 IM SUSP: 0.5000 mL | Freq: Once | INTRAMUSCULAR | Status: DC

## 2024-01-18 MED ORDER — SODIUM CHLORIDE 0.9 % IV SOLN: 250.0000 mL | INTRAVENOUS | Status: DC | PRN

## 2024-01-18 MED ORDER — LIDOCAINE HCL (PF) 1 % IJ SOLN: 30.0000 mL | INTRAMUSCULAR | Status: DC | PRN

## 2024-01-18 MED ORDER — FENTANYL-BUPIVACAINE-NACL 0.5-0.125-0.9 MG/250ML-% EP SOLN: 12.0000 mL/h | EPIDURAL | Status: DC | PRN

## 2024-01-18 MED ORDER — OXYCODONE HCL 5 MG PO TABS: 5.0000 mg | ORAL_TABLET | Freq: Four times a day (QID) | ORAL | Status: DC | PRN

## 2024-01-18 MED ORDER — OXYCODONE-ACETAMINOPHEN 5-325 MG PO TABS: 2.0000 | ORAL_TABLET | ORAL | Status: DC | PRN

## 2024-01-18 MED ORDER — IBUPROFEN 800 MG PO TABS
800.0000 mg | ORAL_TABLET | Freq: Three times a day (TID) | ORAL | Status: DC
  Administered 2024-01-18 – 2024-01-20 (×5): 800 mg via ORAL
  Filled 2024-01-18 (×5): qty 1

## 2024-01-18 MED ORDER — DIBUCAINE (PERIANAL) 1 % EX OINT: 1.0000 | TOPICAL_OINTMENT | CUTANEOUS | Status: DC | PRN

## 2024-01-18 MED ORDER — SODIUM CHLORIDE 0.9% FLUSH: 3.0000 mL | Freq: Two times a day (BID) | INTRAVENOUS | Status: DC

## 2024-01-18 MED ORDER — ONDANSETRON HCL 4 MG/2ML IJ SOLN
4.0000 mg | Freq: Four times a day (QID) | INTRAMUSCULAR | Status: DC | PRN
  Administered 2024-01-18: 4 mg via INTRAVENOUS
  Filled 2024-01-18: qty 2

## 2024-01-18 MED ORDER — WITCH HAZEL-GLYCERIN EX PADS: 1.0000 | MEDICATED_PAD | CUTANEOUS | Status: DC | PRN

## 2024-01-18 MED ORDER — COCONUT OIL OIL: 1.0000 | TOPICAL_OIL | Status: DC | PRN

## 2024-01-18 MED ORDER — SODIUM CHLORIDE 0.9 % IV SOLN
5.0000 10*6.[IU] | Freq: Once | INTRAVENOUS | Status: AC
  Administered 2024-01-18: 5 10*6.[IU] via INTRAVENOUS
  Filled 2024-01-18: qty 5

## 2024-01-18 MED ORDER — SIMETHICONE 80 MG PO CHEW: 80.0000 mg | CHEWABLE_TABLET | ORAL | Status: DC | PRN

## 2024-01-18 MED ORDER — ONDANSETRON HCL 4 MG PO TABS: 4.0000 mg | ORAL_TABLET | ORAL | Status: DC | PRN

## 2024-01-18 NOTE — Anesthesia Preprocedure Evaluation (Addendum)
 Anesthesia Evaluation  Patient identified by MRN, date of birth, ID band Patient awake    Reviewed: Allergy & Precautions, Patient's Chart, lab work & pertinent test results  Airway Mallampati: II       Dental no notable dental hx.    Pulmonary    Pulmonary exam normal        Cardiovascular negative cardio ROS Normal cardiovascular exam     Neuro/Psych    GI/Hepatic   Endo/Other    Renal/GU      Musculoskeletal   Abdominal   Peds  Hematology   Anesthesia Other Findings   Reproductive/Obstetrics (+) Pregnancy                              Anesthesia Physical Anesthesia Plan  ASA: 2  Anesthesia Plan: Epidural   Post-op Pain Management: Minimal or no pain anticipated   Induction:   PONV Risk Score and Plan: 0  Airway Management Planned: Natural Airway  Additional Equipment: None  Intra-op Plan:   Post-operative Plan:   Informed Consent:   Plan Discussed with:   Anesthesia Plan Comments: (Lab Results      Component                Value               Date                      WBC                      17.7 (H)            01/18/2024                HGB                      11.3 (L)            01/18/2024                HCT                      33.5 (L)            01/18/2024                MCV                      74.8 (L)            01/18/2024                PLT                      264                 01/18/2024           )         Anesthesia Quick Evaluation

## 2024-01-18 NOTE — MAU Note (Signed)
 Pt says she was here on Friday morn-  VE- 3 cm UC's strong since 6pm Denies HSV GBS-unsure PNC- HD

## 2024-01-18 NOTE — Anesthesia Procedure Notes (Signed)
 Epidural Patient location during procedure: OB Start time: 01/18/2024 7:25 AM End time: 01/18/2024 7:30 AM  Staffing Anesthesiologist: Tilford Franky BIRCH, MD Performed: anesthesiologist   Preanesthetic Checklist Completed: patient identified, IV checked, site marked, risks and benefits discussed, surgical consent, monitors and equipment checked, pre-op evaluation and timeout performed  Epidural Patient position: sitting Prep: DuraPrep Patient monitoring: heart rate, continuous pulse ox and blood pressure Approach: midline Location: L3-L4 Injection technique: LOR saline  Needle:  Needle type: Tuohy  Needle gauge: 17 G Needle length: 9 cm Catheter type: closed end flexible Catheter size: 20 Guage Test dose: negative and 1.5% lidocaine   Assessment Events: blood not aspirated, no cerebrospinal fluid, injection not painful, no injection resistance and no paresthesia  Additional Notes LOR @ 4  Patient identified. Risks/Benefits/Options discussed with patient including but not limited to bleeding, infection, nerve damage, paralysis, failed block, incomplete pain control, headache, blood pressure changes, nausea, vomiting, reactions to medications, itching and postpartum back pain. Confirmed with bedside nurse the patient's most recent platelet count. Confirmed with patient that they are not currently taking any anticoagulation, have any bleeding history or any family history of bleeding disorders. Patient expressed understanding and wished to proceed. All questions were answered. Sterile technique was used throughout the entire procedure. Please see nursing notes for vital signs. Test dose was given through epidural catheter and negative prior to continuing to dose epidural or start infusion. Warning signs of high block given to the patient including shortness of breath, tingling/numbness in hands, complete motor block, or any concerning symptoms with instructions to call for help. Patient  was given instructions on fall risk and not to get out of bed. All questions and concerns addressed with instructions to call with any issues or inadequate analgesia.    Reason for block:procedure for pain

## 2024-01-18 NOTE — Progress Notes (Signed)
 In to evaluate patient pushing. She has been pushing now for about 1 hour. Prior to this she was laboring down after some initial pushing prior. She is moving the baby with good effort. Patient coached at bedside. Good support team. Continue toward NSVD.  Rollo ONEIDA Bring, MD, FACOG Obstetrician & Gynecologist, [facility] for [facility], [facility]

## 2024-01-18 NOTE — Progress Notes (Signed)
 Patient requesting break from pushing, feeling fatigued and discouraged. Long discussion with patient and support team. Recommend start pitocin to help with the power of her pushes and restart pushing. She restarted pushing at 7pm with good effort. I am actively coaching at bedside. Anticipate NSVD. Discussed possible shoulder and nursing staff reviewed shoulder precautions.  Rollo ONEIDA Bring, MD, FACOG Obstetrician & Gynecologist, [facility] for [facility], [facility]

## 2024-01-18 NOTE — Discharge Summary (Shared)
 Postpartum Discharge Summary  Date of Service updated***     Patient Name: Robin Shepherd DOB: [birth year] MRN: [id number]  Date of admission: 01/18/2024 Delivery date:01/18/2024 Delivering provider: JOMARIE CAMPI A Date of discharge: 01/18/2024  Admitting diagnosis: Indication for care or intervention related to labor and delivery [O75.9] Intrauterine pregnancy: [redacted]w[redacted]d     Secondary diagnosis:  Principal Problem:   Indication for care or intervention related to labor and delivery  Additional problems:  Anemia affecting pregnancy Late Prenatal Care    Discharge diagnosis: Preterm Pregnancy Delivered                                              Post partum procedures:{Postpartum procedures:23558} Augmentation: AROM and Pitocin Complications: {OB Labor/Delivery Complications:20784}  Hospital course: Onset of Labor With Vaginal Delivery      23 y.o. yo G1P0 at [redacted]w[redacted]d was admitted in Latent Labor on 01/18/2024. Labor course was complicated by GBS unknown which was empirically  treated.   Membrane Rupture Time/Date: 1:03 PM,01/18/2024  Delivery Method:Vaginal, Spontaneous Operative Delivery:{Operative Delivery:30121} Episiotomy:   Lacerations:    Patient had a postpartum course complicated by ***.  She is ambulating, tolerating a regular diet, passing flatus, and urinating well. Patient is discharged home in stable condition on 01/18/24.  Newborn Data: Birth date:01/18/2024 Birth time:7:48 PM Gender:Female Living status:Living Apgars: ,  Weight:   Magnesium Sulfate received: {Mag received:30440022} BMZ received: {BMZ received:30440023} Rhophylac:N/A MMR: offered postpartum T-DaP:Given prenatally 12-16-23 Flu: Yes 12-16-23 RSV Vaccine received: No Transfusion:{Transfusion received:30440034}  Immunizations received: Immunization History  Administered Date(s) Administered   DTaP 02/16/2003, 04/19/2003, 06/17/2003, 04/28/2004, 12/26/2006   HIB (PRP-OMP) 02/16/2003,  04/19/2003, 06/17/2003, 04/28/2004   Hepatitis B 04/16/2002, 01/18/2003, 09/16/2003   IPV 02/16/2003, 04/19/2003, 12/20/2003, 12/26/2006   Influenza Nasal 10/19/2010, 12/10/2011   Influenza Split 12/20/2003, 01/15/2005, 01/05/2010   Influenza,inj,Quad PF,6+ Mos 12/24/2018, 12/31/2019, 12/30/2020, 12/06/2021   MMR 12/20/2003, 12/26/2006   Meningococcal Mcv4o 03/15/2021   PFIZER(Purple Top)SARS-COV-2 Vaccination 06/13/2019, 07/06/2019   Pneumococcal Conjugate-13 02/16/2003, 04/19/2003, 06/17/2003, 12/20/2003   Varicella 04/28/2004, 12/26/2006    Physical exam  Vitals:   01/18/24 1632 01/18/24 1855 01/18/24 1901 01/18/24 1931  BP: 96/72 121/80 (!) 140/86 (!) 142/102  Pulse: (!) 107 (!) 110 (!) 110 (!) 135  Resp:      Temp:      TempSrc:      Weight:      Height:       General: {Exam; general:21111117} Lochia: {Desc; appropriate/inappropriate:30686::appropriate} Uterine Fundus: {Desc; firm/soft:30687} Incision: {Exam; incision:21111123} DVT Evaluation: {Exam; dvt:2111122} Labs: Lab Results  Component Value Date   WBC 17.7 (H) 01/18/2024   HGB 11.3 (L) 01/18/2024   HCT 33.5 (L) 01/18/2024   MCV 74.8 (L) 01/18/2024   PLT 264 01/18/2024      Latest Ref Rng & Units 01/18/2024    6:26 AM  CMP  Glucose 70 - 99 mg/dL 95   BUN 6 - 20 mg/dL 5   Creatinine 9.55 - 8.99 mg/dL 9.07   Sodium 864 - 854 mmol/L 135   Potassium 3.5 - 5.1 mmol/L 3.5   Chloride 98 - 111 mmol/L 103   CO2 22 - 32 mmol/L 17   Calcium 8.9 - 10.3 mg/dL 9.1   Total Protein 6.5 - 8.1 g/dL 7.1   Total Bilirubin 0.0 - 1.2 mg/dL 0.9   Alkaline Phos  38 - 126 U/L 351   AST 15 - 41 U/L 41   ALT 0 - 44 U/L 10    Edinburgh Score:     No data to display         No data recorded  After visit meds:  Allergies as of 01/18/2024   No Known Allergies   Med Rec must be completed prior to using this [facility]***        Discharge home in stable condition Infant Feeding: Breast Infant Disposition:home  with mother Discharge instruction: per After Visit Summary and Postpartum booklet. Activity: Advance as tolerated. Pelvic rest for 6 weeks.  Diet: routine diet Future Appointments: Future Appointments  Date Time Provider Department Center  01/30/2024  7:00 AM [facility] PROVIDER 1 [facility]  01/30/2024  7:30 AM [facility] US4 [facility]   Follow up Visit:  Msg sent to scheduling pool 11/29  Please schedule this patient for a In person postpartum visit in 4 weeks with the following provider: Any provider. Additional Postpartum F/U:{PP Procedure:23957}  High risk pregnancy complicated by: late [facility] 2 Delivery mode:  Vaginal, Spontaneous Anticipated Birth Control: ***   01/18/2024 Paralee JONELLE Carpen, MD

## 2024-01-18 NOTE — H&P (Signed)
 OBSTETRIC ADMISSION HISTORY AND PHYSICAL  Robin Shepherd is a 23 y.o. female G1P0 with IUP at [redacted]w[redacted]d (dated by LMP, Estimated Date of Delivery: 02/13/24) presenting for PTL.   [name], [name], [name], no blurry vision, headaches or peripheral edema, and RUQ pain.    She plans on breast feeding. She is undecided for birth control.  She received her prenatal care at [facility]   Prenatal History/Complications:   Anemia affecting pregnancy Late Prenatal Care   Past Medical History: Past Medical History:  Diagnosis Date   Allergy    Obesity    Sinusitis     Past Surgical History: Past Surgical History:  Procedure Laterality Date   NO PAST SURGERIES      Obstetrical History: OB History     Gravida  1   Para      Term      Preterm      AB      Living         SAB      IAB      Ectopic      Multiple      Live Births              Social History Social History   Socioeconomic History   Marital status: Single    Spouse name: Not on file   Number of children: Not on file   Years of education: Not on file   Highest education level: Not on file  Occupational History   Not on file  Tobacco Use   Smoking status: Never   Smokeless tobacco: Never  Substance and Sexual Activity   Alcohol use: No   Drug use: No   Sexual activity: Not Currently    Birth control/protection: None  Other Topics Concern   Not on file  Social History Narrative   Lives with maternal grandparents. In 3rd grade. PE at school 2 days a week.    [place]   [employer], loves shopping   Mother passed away .   Social Drivers of Corporate Investment Banker Strain: Not on file  Food Insecurity: No Food Insecurity (01/17/2024)   Hunger Vital Sign    Worried About Running Out of Food in the Last Year: Never true    Ran Out of Food in the Last Year: Never true  Transportation Needs: No Transportation Needs (01/17/2024)   PRAPARE - Administrator, Civil Service  (Medical): No    Lack of Transportation (Non-Medical): No  Physical Activity: Not on file  Stress: Not on file  Social Connections: Not on file    Family History: Family History  Problem Relation Age of Onset   Early puberty Mother        menarche at age 45    Allergies: No Known Allergies  Medications Prior to Admission  Medication Sig Dispense Refill Last Dose/Taking   ferrous sulfate 324 MG TBEC Take 324 mg by mouth.   01/17/2024   folic acid (FOLVITE) 400 MCG tablet Take 400 mcg by mouth daily.   01/17/2024   Prenatal Vit-Fe Fumarate-FA (PRENATAL MULTIVITAMIN) TABS tablet Take 1 tablet by mouth daily at 12 noon.   01/17/2024   aspirin-acetaminophen-caffeine (EXCEDRIN MIGRAINE) 250-250-65 MG tablet Take by mouth every 6 (six) hours as needed for headache.   More than a month   SUMAtriptan  (IMITREX ) 25 MG tablet Take 1 tablet (25 mg total) by mouth once for 1 dose. May repeat  in 2 hours if headache persists or recurs. 10 tablet 0      Review of Systems  All systems reviewed and negative except as stated in HPI.  Blood pressure 133/76, pulse 93, temperature 98.4 F (36.9 C), temperature source Oral, resp. rate 12, height 5' (1.524 m), weight 83.9 kg. General appearance: alert, cooperative, and appears stated age Lungs: breathing comfortably on room air Heart: regular rate Abdomen: soft, non-tender; gravid Extremities: no edema of bilateral lower extremities DTR's intact Presentation: cephalic Fetal monitoringBaseline: 140 bpm, Variability: Good {> 6 bpm), Accelerations: Reactive, and Decelerations: Absent Uterine activityFrequency: Every 3-6 minutes Dilation: 5 Effacement (%): 80 Station: -1 Exam by:: Dcallaway, RN   Prenatal labs: ABO, Rh:  O (+) Antibody:  Negative Rubella:  Immune RPR:   Pending HBsAg:   NR HIV:   NR GBS:   Not yet collected 1 hr Glucola normal Genetic screening  LR Female Anatomy US  Reported normal Last US : None reported  Prenatal  Transfer Tool  Maternal Diabetes: No Genetic Screening: Normal Maternal Ultrasounds/Referrals: Has not had US  this pregnancy Fetal Ultrasounds or other Referrals:  None Maternal Substance Abuse:  No Significant Maternal Medications:  None Significant Maternal Lab Results:  Other: GBS unknown, late Robin Shepherd Number of Prenatal Visits:Less than or equal to 3 verified prenatal visits Other Comments:  None  No results found for this or any previous visit (from the past 24 hours).  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 G1P0 at [redacted]w[redacted]d here for PTL  #Labor:Expectant management. Went from 3/80/-1 to 5/80/-1 in 2 hours in the MAU.  #Pain: Per patient request. Planning epidural.  #FWB: Cat I #ID: GBS unk > PCN #MOF: Breast #MOC:Undecided #Circ:  yes #Late PNC: Established care with HD on 10/23. Has not yet had ultrasound. Total of 2 prenatal visits.  #PTL: Expectant management as above. GBS culture not available in [place]. Will empirically treat with PCN.   Barkley Angles, MD OB Fellow, Faculty [facility], Center for [facility] 01/18/2024 6:20 AM

## 2024-01-18 NOTE — Progress Notes (Signed)
 Bedside ultrasound performed to estimate gestational age. Measurements are 38-41 wks, consistent with term gestation. Ultrasound powered down without saving imaging before report could be generated.  Robin ONEIDA Bring, MD, FACOG Obstetrician & Gynecologist, [facility] for [facility], [facility]

## 2024-01-18 NOTE — Progress Notes (Signed)
 Labor Progress Note Robin Shepherd is a 23 y.o. G1P0 at [redacted]w[redacted]d presented for IOL  S:  Feeling comfortable with epidural in place.  O:  BP 123/74   Pulse (!) 115   Temp 99.3 F (37.4 C) (Oral)   Resp 18   Ht 5' (1.524 m)   Wt 83.9 kg   BMI 36.11 kg/m  EFM: baseline 135 bpm/ moderate variability/ + accels/ - decels  Toco/IUPC: every 3-4 minutes, lasting 50-60 seconds SVE: Dilation: 9 Effacement (%): 90 Cervical Position: Middle Station: -1 Presentation: Vertex Exam by:: Wyvonna Lemmings RN Pitocin: 0 mu/min  A/P: 23 y.o. G1P0 [redacted]w[redacted]d  # Labor: AROM with moderate clear fluid, progressing slowly but still making some change, consider starting pit 2x2 if not complete in the next couple hours. # Pain: Epidural # FWB: category I # GBS negative   [name], MD 1:17 PM

## 2024-01-19 LAB — CULTURE, BETA STREP (GROUP B ONLY)

## 2024-01-19 NOTE — Clinical Social Work Maternal (Addendum)
 CLINICAL SOCIAL WORK MATERNAL/CHILD NOTE  Patient Details  Name: Robin Shepherd MRN: [id number] Date of Birth: 2001-02-16  Date:  01/19/2024  Clinical Social Worker Initiating Note:  Sharyne Roulette, LCSWA Date/Time: Initiated:  01/19/24/1615     Child's Name:  Robin Shepherd   Biological Parents:  Mother, Father (FOB: Robin [facility], DOB: 08/10/2000)   Need for Interpreter:  None   Reason for Referral:  Late or No Prenatal Care     Address:  [address] [place] [address]    Phone number:  [phone] (home)     Additional phone number:   Household Members/Support Persons (HM/SP):   Household Member/Support Person 1, Household Member/Support Person 2   HM/SP Name Relationship DOB or Age  HM/SP -1   MOB's Nana    HM/SP -2   MOB's grandfather    HM/SP -3        HM/SP -4        HM/SP -5        HM/SP -6        HM/SP -7        HM/SP -8          Natural Supports (not living in the home):  Spouse/significant other, Immediate Family   Professional Supports: None   Employment: Full-time   Type of Work: Scientist, Physiological   Education:  Attending college   Homebound arranged:    Surveyor, Quantity Resources:  Medicaid   Other Resources:  [facility]   Cultural/Religious Considerations Which May Impact Care:  Per [employer], she identifies as Curator  Strengths:  Home prepared for child  , Ability to meet basic needs     Psychotropic Medications:         Pediatrician:     Undecided, has list  Pediatrician List:   [facility] [place]      Pediatrician Fax Number:    Risk Factors/Current Problems:  Compliance with Treatment     Cognitive State:  Able to Concentrate  , Linear Thinking  , Alert  , Goal Oriented     Mood/Affect:  Bright  , Happy  , Relaxed  , Calm     CSW Assessment: CSW was consulted due to late and limited prenatal care. CSW met with MOB at bedside to complete  assessment. When CSW entered room, MOB was observed sitting in hospital bed. FOB was present holding infant skin to skin. CSW introduced self and requested to speak with MOB alone. MOB provided verbal consent for FOB to remain in the room during consult. CSW explained reason for consult. MOB presented as calm, was agreeable to consult and remained engaged throughout encounter.   MOB confirmed demographic information in chart as correct. MOB reports she lives with her grandparents. MOB identified FOB and her father as additional supports. MOB reports she is currently enrolled at [employer] and has a few exams left to complete the semester. MOB reports she plans to take the spring semester off to care for infant. MOB reports her instructors are aware of her pregnancy and she plans to request extensions on her final exams.   CSW assessed for mental health history. MOB denied a history of mental health diagnoses and/or symptoms. CSW assessed for safety. MOB denied current SI/HI. CSW provided education regarding the baby blues period vs. perinatal mood disorders, discussed treatment and gave resources for mental health follow  up if concerns arise.  CSW recommends self-evaluation during the postpartum time period using the New Mom Checklist from Postpartum Progress and encouraged MOB to contact a medical professional if symptoms are noted at any time.    MOB reports she has all needed items for infant, including a car seat and bassinet. MOB states she is undecided on a pediatrician for infant but has a list. MOB also requested an OBGYN list, which CSW provided. CSW inquired about additional resource needs, MOB denied other needs at this time.  CSW informed MOB about hospital drug screen policy due to limited prenatal care. CSW explained that infant's UDS and CDS would be monitored and a CPS report would be made if warranted. MOB expressed understanding. CSW inquired about substance use/barriers to prenatal care during  pregnancy. MOB denied illicit substance use during pregnancy. MOB reports she was late to establish prenatal care at 31w due to having difficulty finding a clinic that was accepting new patients and would take her insurance. MOB denied transportation barriers.   CSW provided review of Sudden Infant Death Syndrome (SIDS) precautions.    CSW will continue to monitor infant's UDS and CDS and make a CPS report is warranted. CSW identifies no further need for intervention and no barriers to discharge at this time.   CSW Plan/Description:  No Further Intervention Required/No Barriers to Discharge, Sudden Infant Death Syndrome (SIDS) Education, Perinatal Mood and Anxiety Disorder (PMADs) Education, Hospital Drug Screen Policy Information, CSW Will Continue to Monitor Umbilical Cord Tissue Drug Screen Results and Make Report if Warranted, Other Information/Referral to [employer] [place], [place] 01/19/2024, 4:20 PM

## 2024-01-19 NOTE — Lactation Note (Signed)
 This note was copied from a baby's chart. Lactation Consultation Note  Patient Name: Robin Shepherd Unijb'd Date: 01/19/2024 Age:21 hours Reason for consult: Primapara;Early term 37-38.6wks (MD saying baby isn't 36 wks.)  P1. RN assisted mom in latching in football hold. Baby BF well. Baby is tongue thrusting some and suckling on his top lips. Showed mom how to touch his top lip w/her nipple to get baby to open wide for deep latching. It takes several tries to get him to open wide. When baby does get a good deep latch baby is swallowing at the breast. Encouraged mom not to let baby on the breast unless he opens wide to prevent nipple trauma. Attempted cross cradle and mom wasn't able to do that well. Newborn feeding habits, STS, I&O, positioning, support, milk storage, pumping, supplementing reviewed. Mom asked about giving formula. LC praised mom on all of the colostrum she has and suggested not to give formula right now because she has so much if she wasn't giving her colostrum it would cut back her supply and could get engorged.  Mom encouraged to feed baby 8-12 times/24 hours and with feeding cues.  Baby still suckling on top lip. LC taught hand expression and collected 4 teaspoon full of colostrum. A little ran out of his mouth but his suckled on that spoon and got most of it. Encouraged mom to call for assistance as needed.  MD says this baby isn't 36 wk. She is says 38+. Baby is peeling on extremities.  Maternal Data Has patient been taught Hand Expression?: Yes Does the patient have breastfeeding experience prior to this delivery?: No  Feeding    LATCH Score Latch: Grasps breast easily, tongue down, lips flanged, rhythmical sucking.  Audible Swallowing: Spontaneous and intermittent  Type of Nipple: Everted at rest and after stimulation  Comfort (Breast/Nipple): Soft / non-tender  Hold (Positioning): Assistance needed to correctly position infant at breast and maintain  latch.  LATCH Score: 9   Lactation Tools Discussed/Used    Interventions Interventions: Breast feeding basics reviewed;Assisted with latch;Skin to skin;Breast massage;Hand express;Breast compression;Adjust position;Support pillows;Position options;Expressed milk;Education;LC Services brochure  Discharge Discharge Education: Outpatient recommendation Pump: DEBP (mom doesn't know what kind)  Consult Status Consult Status: Follow-up Date: 01/19/24 Follow-up type: In-patient    Kendle Turbin G 01/19/2024, 1:33 AM

## 2024-01-19 NOTE — Progress Notes (Signed)
 Post Partum Day 1 Subjective: Patient reports feeling well and is without complaints. She denies chest pain, shortness of breath, lightheaded or dizziness. Patient has ambulated to the restroom, voided and tolerated a regular diet  Objective: Blood pressure 108/65, pulse 86, temperature 98.6 F (37 C), temperature source Oral, resp. rate 16, height 5' (1.524 m), weight 83.9 kg, SpO2 100%, unknown if currently breastfeeding.  Physical Exam:  General: alert, cooperative, and no distress Lochia: appropriate Uterine Fundus: firm Incision: n/a DVT Evaluation: No evidence of DVT seen on physical exam.  Recent Labs    01/17/24 0433 01/18/24 0626  HGB 10.8* 11.3*  HCT 31.9* 33.5*    Assessment/Plan: Plan for discharge tomorrow, Breastfeeding, and Circumcision prior to discharge   LOS: 1 day   Winton Felt, MD 01/19/2024, 12:33 PM

## 2024-01-19 NOTE — Anesthesia Postprocedure Evaluation (Signed)
 Anesthesia Post Note  Patient: Robin Shepherd  Procedure(s) Performed: AN AD HOC LABOR EPIDURAL     Patient location during evaluation: Mother Baby Anesthesia Type: Epidural Level of consciousness: awake and alert Pain management: pain level controlled Vital Signs Assessment: post-procedure vital signs reviewed and stable Respiratory status: spontaneous breathing, nonlabored ventilation and respiratory function stable Cardiovascular status: stable Postop Assessment: no headache, no backache, epidural receding, no apparent nausea or vomiting, patient able to bend at knees, able to ambulate and adequate PO intake Anesthetic complications: no   No notable events documented.  Last Vitals:  Vitals:   01/18/24 2338 01/19/24 0553  BP: 113/72 119/66  Pulse:  73  Resp: 16 18  Temp: 36.9 C 36.4 C    Last Pain:  Vitals:   01/19/24 0554  TempSrc:   PainSc: 0-No pain   Pain Goal:                   [place] [employer]

## 2024-01-20 ENCOUNTER — Other Ambulatory Visit (HOSPITAL_COMMUNITY): Payer: Self-pay

## 2024-01-20 MED ORDER — ACETAMINOPHEN 325 MG PO TABS
650.0000 mg | ORAL_TABLET | ORAL | 0 refills | Status: AC | PRN
  Filled 2024-01-20: qty 30, 3d supply, fill #0

## 2024-01-20 MED ORDER — POLYETHYLENE GLYCOL 3350 17 GM/SCOOP PO POWD
17.0000 g | Freq: Every day | ORAL | 0 refills | Status: AC
  Filled 2024-01-20: qty 238, 14d supply, fill #0

## 2024-01-20 MED ORDER — DIBUCAINE (PERIANAL) 1 % EX OINT: 1.0000 | TOPICAL_OINTMENT | CUTANEOUS | Status: AC | PRN

## 2024-01-20 MED ORDER — WITCH HAZEL-GLYCERIN EX PADS: 1.0000 | MEDICATED_PAD | CUTANEOUS | Status: AC | PRN

## 2024-01-20 MED ORDER — IBUPROFEN 800 MG PO TABS
800.0000 mg | ORAL_TABLET | Freq: Three times a day (TID) | ORAL | 0 refills | Status: AC
  Filled 2024-01-20: qty 30, 10d supply, fill #0

## 2024-01-20 MED ORDER — COCONUT OIL OIL: 1.0000 | TOPICAL_OIL | Status: AC | PRN

## 2024-01-20 MED ORDER — BENZOCAINE-MENTHOL 20-0.5 % EX AERO: 1.0000 | INHALATION_SPRAY | CUTANEOUS | Status: AC | PRN

## 2024-01-20 NOTE — Patient Instructions (Signed)

## 2024-01-20 NOTE — Lactation Note (Addendum)
 This note was copied from a baby's chart. Lactation Consultation Note  Patient Name: Robin Shepherd Unijb'd Date: 01/20/2024 Age:21 hours Reason for consult: Follow-up assessment;1st time breastfeeding  P1, Mother is pumping 25 ml and giving to her baby in a bottle.  Mother states she has had challenges with latching. Suggest calling for LC to help with breastfeeding. Discussed if baby is not latching to pump 8 times per day at least q 3 hours.  Feed on demand with cues.  Goal 8-12+ times per day after first 24 hrs.  Place baby STS if not cueing.  Reviewed engorgement care and monitoring voids/stools.  Maternal Data Has patient been taught Hand Expression?: Yes Does the patient have breastfeeding experience prior to this delivery?: No  Feeding Mother's Current Feeding Choice: Breast Milk  Lactation Tools Discussed/Used Tools: Pump;Flanges Pumping frequency: q 3 hours for 15 min  Interventions Interventions: Education  Discharge Discharge Education: Engorgement and breast care;Warning signs for feeding baby Pump: DEBP  Consult Status Consult Status: Complete Date: 01/20/24    Shannon Dines [facility] 01/20/2024, 10:04 AM

## 2024-01-28 ENCOUNTER — Telehealth (HOSPITAL_COMMUNITY): Payer: Self-pay

## 2024-01-28 NOTE — Telephone Encounter (Signed)
 01/28/2024 1450  Name: Robin Shepherd MRN: [id number] DOB: May 10, 2000  Reason for Call:  Transition of Care Hospital Discharge Call  Contact Status: Patient Contact Status: Message  Language assistant needed:          Follow-Up Questions:    Van Postnatal Depression Scale:  In the Past 7 Days:    PHQ2-9 Depression Scale:     Discharge Follow-up:    Post-discharge interventions: NA  Signature  Rosaline Deretha PEAK

## 2024-01-30 ENCOUNTER — Ambulatory Visit

## 2024-01-30 ENCOUNTER — Other Ambulatory Visit

## 2024-02-28 ENCOUNTER — Ambulatory Visit: Admitting: Certified Nurse Midwife

## 2024-02-28 ENCOUNTER — Other Ambulatory Visit: Payer: Self-pay

## 2024-02-28 DIAGNOSIS — Z3009 Encounter for other general counseling and advice on contraception: Secondary | ICD-10-CM

## 2024-02-28 NOTE — Progress Notes (Signed)
 "  Postpartum Visit Note  Robin Shepherd is a 24 y.o. [id number] female who presents for a postpartum visit. She is 5.6 weeks postpartum following a normal spontaneous vaginal delivery.  I have fully reviewed the prenatal and intrapartum course. The delivery was at 36.2 gestational weeks.  Anesthesia: epidural. Postpartum course has been well. Baby is doing well. Baby is feeding by both breast and bottle - Similac Advance. Bleeding no bleeding. Bowel function is normal. Bladder function is normal. Patient is sexually active. Contraception method is none. Postpartum depression screening: negative.  Health Maintenance Due  Topic Date Due   DTaP/Tdap/Td (6 - Tdap) 12/16/2013   HPV VACCINES (1 - 3-dose series) Never done   Meningococcal B Vaccine (1 of 2 - Standard) Never done   Influenza Vaccine  09/20/2023   COVID-19 Vaccine (3 - 2025-26 season) 10/21/2023   Cervical Cancer Screening (Pap smear)  Never done   The following portions of the patient's history were reviewed and updated as appropriate: allergies, current medications, past family history, past medical history, past social history, past surgical history, and problem list.  Review of Systems Pertinent items noted in HPI and remainder of comprehensive ROS otherwise negative.  Objective:  BP 113/78   Pulse 76   Wt 161 lb 1.6 oz (73.1 kg)   LMP 02/24/2024 (Exact Date)   Breastfeeding Yes   BMI 31.46 kg/m    Constitutional: Alert, oriented female in no physical distress.  HEENT: PERRLA Skin: normal color and turgor, no rash Cardiovascular: normal rate & rhythm Respiratory: normal effort, no problems with respiration noted GI: Abd soft, non-tender MS: Extremities nontender, no edema, normal ROM Neurologic: Alert and oriented x 4.  GU: no CVA tenderness Pelvic: exam deferred Breasts: normal lactating breasts, no edema or signs of nipple damage  Assessment:  1. Postpartum care and examination (Primary) - Recovering well, baby  is sleeping through the night   2. Birth control counseling - Reviewed all forms in stepwise fashion, declined anything today, wants time to consider Nexplanon or Depo - Reminded that she is currently fertile and encouraged to use a barrier method    Plan:   Essential components of care per ACOG recommendations:  1.  Mood and well being: Patient with negative depression screening today. Reviewed local resources for support.  - Patient tobacco use? No.   - hx of drug use? No.    2. Infant care and feeding:  -Patient currently breastmilk feeding? Yes. Reviewed importance of draining breast regularly to support lactation.  -Social determinants of health (SDOH) reviewed in EPIC. No concerns   3. Sexuality, contraception and birth spacing - Patient does not want a pregnancy in the next year.  Desired family size is 1 children.  - Reviewed reproductive life planning. Reviewed contraceptive methods based on pt preferences and effectiveness.  Patient desired Female Condom today.   - Discussed birth spacing of 18 months  4. Sleep and fatigue -Encouraged family/partner/community support of 4 hrs of uninterrupted sleep to help with mood and fatigue  5. Physical Recovery  - Discussed patients delivery and complications. She describes her labor as good. - Patient had a Vaginal, no problems at delivery. Patient had a 2nd degree laceration. Perineal healing reviewed. Patient expressed understanding - Patient has urinary incontinence? No. - Patient is safe to resume physical and sexual activity  6.  Health Maintenance - HM due items addressed Yes - Pap smear not done at today's visit. Has never had one, declined today. -Breast  Cancer screening indicated? No.   7. Chronic Disease/Pregnancy Condition follow up: None - PCP follow up  Cornell JONELLE Finder, CNM [facility] [employer] [facility]  "

## 2024-03-17 ENCOUNTER — Encounter: Payer: Self-pay | Admitting: Certified Nurse Midwife
